# Patient Record
Sex: Female | Born: 1960 | Race: White | Hispanic: No | Marital: Married | Smoking: Never smoker
Health system: Southern US, Community
[De-identification: ages and names within clinical notes are randomized; demographics above are authoritative.]

## PROBLEM LIST (undated history)

## (undated) DIAGNOSIS — E039 Hypothyroidism, unspecified: Secondary | ICD-10-CM

## (undated) DIAGNOSIS — I10 Essential (primary) hypertension: Secondary | ICD-10-CM

## (undated) DIAGNOSIS — M1990 Unspecified osteoarthritis, unspecified site: Secondary | ICD-10-CM

## (undated) DIAGNOSIS — G4730 Sleep apnea, unspecified: Secondary | ICD-10-CM

## (undated) DIAGNOSIS — K219 Gastro-esophageal reflux disease without esophagitis: Secondary | ICD-10-CM

---

## 1999-12-03 HISTORY — PX: KNEE ARTHROSCOPY WITH ANTERIOR CRUCIATE LIGAMENT (ACL) REPAIR: SHX5644

## 2003-12-03 HISTORY — PX: ABDOMINAL HYSTERECTOMY: SHX81

## 2008-12-02 HISTORY — PX: JOINT REPLACEMENT: SHX530

## 2009-10-23 ENCOUNTER — Inpatient Hospital Stay (HOSPITAL_COMMUNITY): Admission: RE | Admit: 2009-10-23 | Discharge: 2009-10-26 | Payer: Self-pay | Admitting: Orthopedic Surgery

## 2011-03-06 LAB — CBC
HCT: 29.2 % — ABNORMAL LOW (ref 36.0–46.0)
HCT: 30.6 % — ABNORMAL LOW (ref 36.0–46.0)
HCT: 32.4 % — ABNORMAL LOW (ref 36.0–46.0)
Hemoglobin: 10.5 g/dL — ABNORMAL LOW (ref 12.0–15.0)
Hemoglobin: 11 g/dL — ABNORMAL LOW (ref 12.0–15.0)
Hemoglobin: 13.2 g/dL (ref 12.0–15.0)
MCHC: 34.4 g/dL (ref 30.0–36.0)
MCV: 84.5 fL (ref 78.0–100.0)
MCV: 85.1 fL (ref 78.0–100.0)
Platelets: 165 10*3/uL (ref 150–400)
RBC: 3.62 MIL/uL — ABNORMAL LOW (ref 3.87–5.11)
RDW: 15.3 % (ref 11.5–15.5)
WBC: 5.5 10*3/uL (ref 4.0–10.5)
WBC: 8.1 10*3/uL (ref 4.0–10.5)
WBC: 9.4 10*3/uL (ref 4.0–10.5)

## 2011-03-06 LAB — BASIC METABOLIC PANEL
BUN: 8 mg/dL (ref 6–23)
CO2: 26 mEq/L (ref 19–32)
Chloride: 100 mEq/L (ref 96–112)
Chloride: 101 mEq/L (ref 96–112)
GFR calc Af Amer: >60 mL/min (ref >60)
GFR calc non Af Amer: >60 mL/min (ref >60)
Potassium: 3.6 mEq/L (ref 3.5–5.1)
Potassium: 3.8 mEq/L (ref 3.5–5.1)
Potassium: 4 mEq/L (ref 3.5–5.1)
Sodium: 132 mEq/L — ABNORMAL LOW (ref 135–145)
Sodium: 134 mEq/L — ABNORMAL LOW (ref 135–145)

## 2011-03-06 LAB — URINALYSIS, ROUTINE W REFLEX MICROSCOPIC
Glucose, UA: NEGATIVE mg/dL
Hgb urine dipstick: NEGATIVE
Protein, ur: NEGATIVE mg/dL
Specific Gravity, Urine: 1.014 (ref 1.005–1.030)
pH: 7 (ref 5.0–8.0)

## 2011-03-06 LAB — COMPREHENSIVE METABOLIC PANEL
ALT: 24 U/L (ref 0–35)
Albumin: 4.1 g/dL (ref 3.5–5.2)
Alkaline Phosphatase: 68 U/L (ref 39–117)
Chloride: 108 mEq/L (ref 96–112)
Glucose, Bld: 97 mg/dL (ref 70–99)
Potassium: 4.1 mEq/L (ref 3.5–5.1)
Sodium: 141 mEq/L (ref 135–145)
Total Bilirubin: 0.5 mg/dL (ref 0.3–1.2)
Total Protein: 7.5 g/dL (ref 6.0–8.3)

## 2011-03-06 LAB — PROTIME-INR
INR: 1.6 — ABNORMAL HIGH (ref 0.00–1.49)
Prothrombin Time: 13.2 seconds (ref 11.6–15.2)

## 2011-03-06 LAB — TYPE AND SCREEN: Antibody Screen: NEGATIVE

## 2011-03-06 LAB — ABO/RH: ABO/RH(D): O NEG

## 2013-12-31 ENCOUNTER — Ambulatory Visit: Payer: Self-pay | Admitting: Specialist

## 2013-12-31 DIAGNOSIS — I10 Essential (primary) hypertension: Secondary | ICD-10-CM

## 2013-12-31 LAB — COMPREHENSIVE METABOLIC PANEL
ALK PHOS: 78 U/L
AST: 22 U/L (ref 15–37)
Albumin: 4 g/dL (ref 3.4–5.0)
Anion Gap: 4 — ABNORMAL LOW (ref 7–16)
BUN: 23 mg/dL — ABNORMAL HIGH (ref 7–18)
Bilirubin,Total: 0.5 mg/dL (ref 0.2–1.0)
CHLORIDE: 107 mmol/L (ref 98–107)
CO2: 26 mmol/L (ref 21–32)
Calcium, Total: 9.4 mg/dL (ref 8.5–10.1)
Creatinine: 0.84 mg/dL (ref 0.60–1.30)
EGFR (Non-African Amer.): >60
Glucose: 94 mg/dL (ref 65–99)
Osmolality: 277 (ref 275–301)
POTASSIUM: 4.1 mmol/L (ref 3.5–5.1)
SGPT (ALT): 23 U/L (ref 12–78)
Sodium: 137 mmol/L (ref 136–145)
TOTAL PROTEIN: 7.4 g/dL (ref 6.4–8.2)

## 2013-12-31 LAB — CBC WITH DIFFERENTIAL/PLATELET
BASOS ABS: 0 10*3/uL (ref 0.0–0.1)
Basophil %: 0.6 %
Eosinophil #: 0.1 10*3/uL (ref 0.0–0.7)
Eosinophil %: 1.4 %
HCT: 40.4 % (ref 35.0–47.0)
HGB: 13.7 g/dL (ref 12.0–16.0)
Lymphocyte #: 1.3 10*3/uL (ref 1.0–3.6)
Lymphocyte %: 17.6 %
MCH: 28.9 pg (ref 26.0–34.0)
MCHC: 34.1 g/dL (ref 32.0–36.0)
MCV: 85 fL (ref 80–100)
MONO ABS: 0.5 x10 3/mm (ref 0.2–0.9)
Monocyte %: 7.3 %
NEUTROS ABS: 5.3 10*3/uL (ref 1.4–6.5)
Neutrophil %: 73.1 %
PLATELETS: 235 10*3/uL (ref 150–440)
RBC: 4.76 10*6/uL (ref 3.80–5.20)
RDW: 14.2 % (ref 11.5–14.5)
WBC: 7.3 10*3/uL (ref 3.6–11.0)

## 2013-12-31 LAB — PROTIME-INR
INR: 1
Prothrombin Time: 13.3 secs (ref 11.5–14.7)

## 2013-12-31 LAB — PHOSPHORUS: Phosphorus: 2.5 mg/dL (ref 2.5–4.9)

## 2013-12-31 LAB — APTT: ACTIVATED PTT: 29.7 s (ref 23.6–35.9)

## 2013-12-31 LAB — BILIRUBIN, DIRECT: Bilirubin, Direct: 0.1 mg/dL (ref 0.00–0.20)

## 2013-12-31 LAB — IRON AND TIBC
IRON SATURATION: 25 %
IRON: 87 ug/dL (ref 50–170)
Iron Bind.Cap.(Total): 353 ug/dL (ref 250–450)
Unbound Iron-Bind.Cap.: 266 ug/dL

## 2013-12-31 LAB — FERRITIN: Ferritin (ARMC): 28 ng/mL (ref 8–388)

## 2013-12-31 LAB — AMYLASE: AMYLASE: 48 U/L (ref 25–115)

## 2013-12-31 LAB — HEMOGLOBIN A1C: Hemoglobin A1C: 5.8 % (ref 4.2–6.3)

## 2013-12-31 LAB — MAGNESIUM: Magnesium: 1.9 mg/dL

## 2013-12-31 LAB — LIPASE, BLOOD: LIPASE: 121 U/L (ref 73–393)

## 2013-12-31 LAB — FOLATE: FOLIC ACID: 13.4 ng/mL (ref 3.1–100.0)

## 2013-12-31 LAB — TSH: THYROID STIMULATING HORM: 3.01 u[IU]/mL

## 2014-01-12 ENCOUNTER — Ambulatory Visit: Payer: Self-pay | Admitting: Specialist

## 2014-01-30 ENCOUNTER — Ambulatory Visit: Payer: Self-pay | Admitting: Specialist

## 2014-04-22 ENCOUNTER — Ambulatory Visit: Payer: Self-pay | Admitting: Gastroenterology

## 2014-04-30 LAB — PATHOLOGY REPORT

## 2014-05-31 ENCOUNTER — Other Ambulatory Visit: Payer: Self-pay | Admitting: Orthopaedic Surgery

## 2014-05-31 DIAGNOSIS — M25551 Pain in right hip: Secondary | ICD-10-CM

## 2014-06-02 ENCOUNTER — Ambulatory Visit
Admission: RE | Admit: 2014-06-02 | Discharge: 2014-06-02 | Disposition: A | Discharge status: Home / Self Care | Payer: BC Managed Care – PPO | Source: Ambulatory Visit | Attending: Orthopaedic Surgery | Admitting: Orthopaedic Surgery

## 2014-06-02 DIAGNOSIS — M25551 Pain in right hip: Secondary | ICD-10-CM

## 2014-07-02 HISTORY — PX: LAPAROSCOPIC GASTRIC SLEEVE RESECTION: SHX5895

## 2014-09-20 ENCOUNTER — Other Ambulatory Visit (HOSPITAL_COMMUNITY): Payer: Self-pay | Admitting: Orthopaedic Surgery

## 2014-10-18 ENCOUNTER — Encounter (HOSPITAL_COMMUNITY): Payer: Self-pay

## 2014-10-18 ENCOUNTER — Encounter (HOSPITAL_COMMUNITY)
Admission: RE | Admit: 2014-10-18 | Discharge: 2014-10-18 | Disposition: A | Discharge status: Home / Self Care | Payer: BC Managed Care – PPO | Source: Ambulatory Visit | Attending: Orthopaedic Surgery | Admitting: Orthopaedic Surgery

## 2014-10-18 ENCOUNTER — Ambulatory Visit (HOSPITAL_COMMUNITY)
Admission: RE | Admit: 2014-10-18 | Discharge: 2014-10-18 | Disposition: A | Discharge status: Home / Self Care | Payer: BC Managed Care – PPO | Source: Ambulatory Visit | Attending: Anesthesiology | Admitting: Anesthesiology

## 2014-10-18 DIAGNOSIS — Z01811 Encounter for preprocedural respiratory examination: Secondary | ICD-10-CM | POA: Insufficient documentation

## 2014-10-18 DIAGNOSIS — I10 Essential (primary) hypertension: Secondary | ICD-10-CM | POA: Insufficient documentation

## 2014-10-18 DIAGNOSIS — Z01812 Encounter for preprocedural laboratory examination: Secondary | ICD-10-CM | POA: Insufficient documentation

## 2014-10-18 HISTORY — DX: Sleep apnea, unspecified: G47.30

## 2014-10-18 HISTORY — DX: Gastro-esophageal reflux disease without esophagitis: K21.9

## 2014-10-18 HISTORY — DX: Unspecified osteoarthritis, unspecified site: M19.90

## 2014-10-18 HISTORY — DX: Essential (primary) hypertension: I10

## 2014-10-18 HISTORY — DX: Hypothyroidism, unspecified: E03.9

## 2014-10-18 LAB — CBC
HCT: 39.5 % (ref 36.0–46.0)
Hemoglobin: 12.9 g/dL (ref 12.0–15.0)
MCH: 29.3 pg (ref 26.0–34.0)
MCHC: 32.7 g/dL (ref 30.0–36.0)
MCV: 89.6 fL (ref 78.0–100.0)
Platelets: 229 10*3/uL (ref 150–400)
RBC: 4.41 MIL/uL (ref 3.87–5.11)
RDW: 14 % (ref 11.5–15.5)
WBC: 7.6 10*3/uL (ref 4.0–10.5)

## 2014-10-18 LAB — URINALYSIS, ROUTINE W REFLEX MICROSCOPIC
Bilirubin Urine: NEGATIVE
Glucose, UA: NEGATIVE mg/dL
Hgb urine dipstick: NEGATIVE
Ketones, ur: NEGATIVE mg/dL
Leukocytes, UA: NEGATIVE
Nitrite: NEGATIVE
Protein, ur: NEGATIVE mg/dL
Specific Gravity, Urine: 1.009 (ref 1.005–1.030)
Urobilinogen, UA: 0.2 mg/dL (ref 0.0–1.0)
pH: 6 (ref 5.0–8.0)

## 2014-10-18 LAB — BASIC METABOLIC PANEL
Anion gap: 13 (ref 5–15)
BUN: 19 mg/dL (ref 6–23)
CALCIUM: 10.8 mg/dL — AB (ref 8.4–10.5)
CHLORIDE: 102 meq/L (ref 96–112)
CO2: 23 meq/L (ref 19–32)
CREATININE: 0.67 mg/dL (ref 0.50–1.10)
GFR calc Af Amer: >90 mL/min (ref >90)
GFR calc non Af Amer: >90 mL/min (ref >90)
Glucose, Bld: 93 mg/dL (ref 70–99)
Potassium: 4.1 mEq/L (ref 3.7–5.3)
Sodium: 138 mEq/L (ref 137–147)

## 2014-10-18 LAB — PROTIME-INR
INR: 1.06 (ref 0.00–1.49)
Prothrombin Time: 13.9 seconds (ref 11.6–15.2)

## 2014-10-18 LAB — SURGICAL PCR SCREEN
MRSA, PCR: NEGATIVE
Staphylococcus aureus: POSITIVE — AB

## 2014-10-18 LAB — APTT: APTT: 31 s (ref 24–37)

## 2014-10-18 NOTE — Patient Instructions (Addendum)
20 Lisa DuosSybil G [name]  10/18/2014   Your procedure is scheduled on: Friday 10/28/14  [facility] at 05:30 AM.  Call this number if you have problems the morning of surgery [phone]   Remember: Please bring CPAP mask and tubing on day of surgery.    Do not eat food or drink liquids After Midnight.     Take these medicines the morning of surgery with A SIP OF WATER: synthroid   Do not wear jewelry, make-up or nail polish.  Do not wear lotions, powders, or perfumes.   Do not shave 48 hours prior to surgery. Men may shave face and neck.  Do not bring valuables to the hospital.  Contacts, dentures or bridgework may not be worn into surgery.  Leave suitcase in the car. After surgery it may be brought to your room.  For patients admitted to the hospital, checkout time is 11:00 AM the day of discharge.   Please read over the following fact sheets that you were given: MRSA Information   [place] - Preparing for Surgery Before surgery, you can play an important role.  Because skin is not sterile, your skin needs to be as free of germs as possible.  You can reduce the number of germs on your skin by washing with CHG (chlorahexidine gluconate) soap before surgery.  CHG is an antiseptic cleaner which kills germs and bonds with the skin to continue killing germs even after washing. Please DO NOT use if you have an allergy to CHG or antibacterial soaps.  If your skin becomes reddened/irritated stop using the CHG and inform your nurse when you arrive at Short Stay. Do not shave (including legs and underarms) for at least 48 hours prior to the first CHG shower.  You may shave your face/neck. Please follow these instructions carefully:  1.  Shower with CHG Soap the night before surgery and the  morning of Surgery.  2.  If you choose to wash your hair, wash your hair first as usual with your  normal  shampoo.  3.  After you shampoo, rinse your hair and body thoroughly to  remove the  shampoo.                            4.  Use CHG as you would any other liquid soap.  You can apply chg directly  to the skin and wash                       Gently with a scrungie or clean washcloth.  5.  Apply the CHG Soap to your body ONLY FROM THE NECK DOWN.   Do not use on face/ open                           Wound or open sores. Avoid contact with eyes, ears mouth and genitals (private parts).                       Wash face,  Genitals (private parts) with your normal soap.             6.  Wash thoroughly, paying special attention to the area where your surgery  will be performed.  7.  Thoroughly rinse your body with warm water from the neck down.  8.  DO NOT shower/wash with your  normal soap after using and rinsing off  the CHG Soap.                9.  Pat yourself dry with a clean towel.            10.  Wear clean pajamas.            11.  Place clean sheets on your bed the night of your first shower and do not  sleep with pets. Day of Surgery : Do not apply any lotions/deodorants the morning of surgery.  Please wear clean clothes to the hospital/surgery center.  FAILURE TO FOLLOW THESE INSTRUCTIONS MAY RESULT IN THE CANCELLATION OF YOUR SURGERY PATIENT SIGNATURE_________________________________  NURSE SIGNATURE__________________________________  ________________________________________________________________________  WHAT IS A BLOOD TRANSFUSION? Blood Transfusion Information  A transfusion is the replacement of blood or some of its parts. Blood is made up of multiple cells which provide different functions.  Red blood cells carry oxygen and are used for blood loss replacement.  Rix blood cells fight against infection.  Platelets control bleeding.  Plasma helps clot blood.  Other blood products are available for specialized needs, such as hemophilia or other clotting disorders. BEFORE THE TRANSFUSION  Who gives blood for transfusions?   Healthy volunteers who  are fully evaluated to make sure their blood is safe. This is blood bank blood. Transfusion therapy is the safest it has ever been in the practice of medicine. Before blood is taken from a donor, a complete history is taken to make sure that person has no history of diseases nor engages in risky social behavior (examples are intravenous drug use or sexual activity with multiple partners). The donor's travel history is screened to minimize risk of transmitting infections, such as malaria. The donated blood is tested for signs of infectious diseases, such as HIV and hepatitis. The blood is then tested to be sure it is compatible with you in order to minimize the chance of a transfusion reaction. If you or a relative donates blood, this is often done in anticipation of surgery and is not appropriate for emergency situations. It takes many days to process the donated blood. RISKS AND COMPLICATIONS Although transfusion therapy is very safe and saves many lives, the main dangers of transfusion include:  1. Getting an infectious disease. 2. Developing a transfusion reaction. This is an allergic reaction to something in the blood you were given. Every precaution is taken to prevent this. The decision to have a blood transfusion has been considered carefully by your caregiver before blood is given. Blood is not given unless the benefits outweigh the risks. AFTER THE TRANSFUSION  Right after receiving a blood transfusion, you will usually feel much better and more energetic. This is especially true if your red blood cells have gotten low (anemic). The transfusion raises the level of the red blood cells which carry oxygen, and this usually causes an energy increase.  The nurse administering the transfusion will monitor you carefully for complications. HOME CARE INSTRUCTIONS  No special instructions are needed after a transfusion. You may find your energy is better. Speak with your caregiver about any limitations  on activity for underlying diseases you may have. SEEK MEDICAL CARE IF:   Your condition is not improving after your transfusion.  You develop redness or irritation at the intravenous (IV) site. SEEK IMMEDIATE MEDICAL CARE IF:  Any of the following symptoms occur over the next 12 hours:  Shaking chills.  You have a temperature by mouth  above 102 F (38.9 C), not controlled by medicine.  Chest, back, or muscle pain.  People around you feel you are not acting correctly or are confused.  Shortness of breath or difficulty breathing.  Dizziness and fainting.  You get a rash or develop hives.  You have a decrease in urine output.  Your urine turns a dark color or changes to pink, red, or brown. Any of the following symptoms occur over the next 10 days:  You have a temperature by mouth above 102 F (38.9 C), not controlled by medicine.  Shortness of breath.  Weakness after normal activity.  The [place]ord part of the eye turns yellow (jaundice).  You have a decrease in the amount of urine or are urinating less often.  Your urine turns a dark color or changes to pink, red, or brown. Document Released: 11/15/2000 Document Revised: 02/10/2012 Document Reviewed: 07/04/2008 ExitCare Patient Information 2014 [place], [place].  _______________________________________________________________________  Incentive Spirometer  An incentive spirometer is a tool that can help keep your lungs clear and active. This tool measures how well you are filling your lungs with each breath. Taking long deep breaths may help reverse or decrease the chance of developing breathing (pulmonary) problems (especially infection) following:  A long period of time when you are unable to move or be active. BEFORE THE PROCEDURE   If the spirometer includes an indicator to show your best effort, your nurse or respiratory therapist will set it to a desired goal.  If possible, sit up straight or lean slightly  forward. Try not to slouch.  Hold the incentive spirometer in an upright position. INSTRUCTIONS FOR USE  3. Sit on the edge of your bed if possible, or sit up as far as you can in bed or on a chair. 4. Hold the incentive spirometer in an upright position. 5. Breathe out normally. 6. Place the mouthpiece in your mouth and seal your lips tightly around it. 7. Breathe in slowly and as deeply as possible, raising the piston or the ball toward the top of the column. 8. Hold your breath for 3-5 seconds or for as long as possible. Allow the piston or ball to fall to the bottom of the column. 9. Remove the mouthpiece from your mouth and breathe out normally. 10. Rest for a few seconds and repeat Steps 1 through 7 at least 10 times every 1-2 hours when you are awake. Take your time and take a few normal breaths between deep breaths. 11. The spirometer may include an indicator to show your best effort. Use the indicator as a goal to work toward during each repetition. 12. After each set of 10 deep breaths, practice coughing to be sure your lungs are clear. If you have an incision (the cut made at the time of surgery), support your incision when coughing by placing a pillow or rolled up towels firmly against it. Once you are able to get out of bed, walk around indoors and cough well. You may stop using the incentive spirometer when instructed by your caregiver.  RISKS AND COMPLICATIONS  Take your time so you do not get dizzy or light-headed.  If you are in pain, you may need to take or ask for pain medication before doing incentive spirometry. It is harder to take a deep breath if you are having pain. AFTER USE  Rest and breathe slowly and easily.  It can be helpful to keep track of a log of your progress. Your caregiver can provide  you with a simple table to help with this. If you are using the spirometer at home, follow these instructions: SEEK MEDICAL CARE IF:   You are having difficultly using  the spirometer.  You have trouble using the spirometer as often as instructed.  Your pain medication is not giving enough relief while using the spirometer.  You develop fever of 100.5 F (38.1 C) or higher. SEEK IMMEDIATE MEDICAL CARE IF:   You cough up bloody sputum that had not been present before.  You develop fever of 102 F (38.9 C) or greater.  You develop worsening pain at or near the incision site. MAKE SURE YOU:   Understand these instructions.  Will watch your condition.  Will get help right away if you are not doing well or get worse. Document Released: 03/31/2007 Document Revised: 02/10/2012 Document Reviewed: 06/01/2007 [facility] Patient Information 2014 [place], [place].   ________________________________________________________________________

## 2014-10-18 NOTE — Progress Notes (Signed)
EKG 12/31/13 on chart, sleep study report 03/15/14 [facility] on chart

## 2014-10-28 ENCOUNTER — Inpatient Hospital Stay (HOSPITAL_COMMUNITY): Payer: BC Managed Care – PPO | Admitting: Certified Registered"

## 2014-10-28 ENCOUNTER — Inpatient Hospital Stay (HOSPITAL_COMMUNITY): Payer: BC Managed Care – PPO

## 2014-10-28 ENCOUNTER — Encounter (HOSPITAL_COMMUNITY): Payer: Self-pay | Admitting: *Deleted

## 2014-10-28 ENCOUNTER — Encounter (HOSPITAL_COMMUNITY)
Admission: RE | Disposition: A | Discharge status: Home / Self Care | Payer: Self-pay | Source: Ambulatory Visit | Attending: Orthopaedic Surgery

## 2014-10-28 ENCOUNTER — Inpatient Hospital Stay (HOSPITAL_COMMUNITY)
Admission: RE | Admit: 2014-10-28 | Discharge: 2014-10-29 | DRG: 470 | Disposition: A | Discharge status: Home / Self Care | Payer: BC Managed Care – PPO | Source: Ambulatory Visit | Attending: Orthopaedic Surgery | Admitting: Orthopaedic Surgery

## 2014-10-28 DIAGNOSIS — Z9884 Bariatric surgery status: Secondary | ICD-10-CM

## 2014-10-28 DIAGNOSIS — K219 Gastro-esophageal reflux disease without esophagitis: Secondary | ICD-10-CM | POA: Diagnosis present

## 2014-10-28 DIAGNOSIS — Z90710 Acquired absence of both cervix and uterus: Secondary | ICD-10-CM

## 2014-10-28 DIAGNOSIS — M1611 Unilateral primary osteoarthritis, right hip: Secondary | ICD-10-CM | POA: Diagnosis present

## 2014-10-28 DIAGNOSIS — D62 Acute posthemorrhagic anemia: Secondary | ICD-10-CM | POA: Diagnosis not present

## 2014-10-28 DIAGNOSIS — I959 Hypotension, unspecified: Secondary | ICD-10-CM | POA: Diagnosis present

## 2014-10-28 DIAGNOSIS — I10 Essential (primary) hypertension: Secondary | ICD-10-CM | POA: Diagnosis present

## 2014-10-28 DIAGNOSIS — E039 Hypothyroidism, unspecified: Secondary | ICD-10-CM | POA: Diagnosis present

## 2014-10-28 DIAGNOSIS — G4730 Sleep apnea, unspecified: Secondary | ICD-10-CM | POA: Diagnosis present

## 2014-10-28 DIAGNOSIS — Z419 Encounter for procedure for purposes other than remedying health state, unspecified: Secondary | ICD-10-CM

## 2014-10-28 DIAGNOSIS — Z96641 Presence of right artificial hip joint: Secondary | ICD-10-CM

## 2014-10-28 HISTORY — PX: TOTAL HIP ARTHROPLASTY: SHX124

## 2014-10-28 LAB — TYPE AND SCREEN
ABO/RH(D): O NEG
Antibody Screen: NEGATIVE

## 2014-10-28 SURGERY — ARTHROPLASTY, HIP, TOTAL, ANTERIOR APPROACH
Anesthesia: Spinal | Site: Hip | Laterality: Right

## 2014-10-28 MED ORDER — LACTATED RINGERS IV SOLN
INTRAVENOUS | Status: DC | PRN
  Administered 2014-10-28 (×2): via INTRAVENOUS

## 2014-10-28 MED ORDER — CEFAZOLIN SODIUM-DEXTROSE 2-3 GM-% IV SOLR
INTRAVENOUS | Status: AC
  Filled 2014-10-28: qty 50

## 2014-10-28 MED ORDER — KETOROLAC TROMETHAMINE 30 MG/ML IJ SOLN: 15.0000 mg | Freq: Once | INTRAMUSCULAR | Status: DC | PRN

## 2014-10-28 MED ORDER — PHENYLEPHRINE 40 MCG/ML (10ML) SYRINGE FOR IV PUSH (FOR BLOOD PRESSURE SUPPORT)
PREFILLED_SYRINGE | INTRAVENOUS | Status: AC
  Filled 2014-10-28: qty 10

## 2014-10-28 MED ORDER — LEVOTHYROXINE SODIUM 88 MCG PO TABS
88.0000 ug | ORAL_TABLET | Freq: Every day | ORAL | Status: DC
  Administered 2014-10-29: 88 ug via ORAL
  Filled 2014-10-28 (×2): qty 1

## 2014-10-28 MED ORDER — SODIUM CHLORIDE 0.9 % IV SOLN
INTRAVENOUS | Status: DC
  Administered 2014-10-28: 10:00:00 via INTRAVENOUS

## 2014-10-28 MED ORDER — DOCUSATE SODIUM 100 MG PO CAPS
100.0000 mg | ORAL_CAPSULE | Freq: Two times a day (BID) | ORAL | Status: DC
  Administered 2014-10-28 – 2014-10-29 (×2): 100 mg via ORAL

## 2014-10-28 MED ORDER — CEFAZOLIN SODIUM 1-5 GM-% IV SOLN
1.0000 g | Freq: Four times a day (QID) | INTRAVENOUS | Status: AC
  Administered 2014-10-28 (×2): 1 g via INTRAVENOUS
  Filled 2014-10-28 (×2): qty 50

## 2014-10-28 MED ORDER — PROPOFOL 10 MG/ML IV BOLUS
INTRAVENOUS | Status: AC
  Filled 2014-10-28: qty 20

## 2014-10-28 MED ORDER — PHENYLEPHRINE HCL 10 MG/ML IJ SOLN
INTRAMUSCULAR | Status: AC
  Filled 2014-10-28: qty 1

## 2014-10-28 MED ORDER — 0.9 % SODIUM CHLORIDE (POUR BTL) OPTIME
TOPICAL | Status: DC | PRN
  Administered 2014-10-28: 1000 mL

## 2014-10-28 MED ORDER — ACETAMINOPHEN 650 MG RE SUPP: 650.0000 mg | Freq: Four times a day (QID) | RECTAL | Status: DC | PRN

## 2014-10-28 MED ORDER — PROPOFOL 10 MG/ML IV BOLUS
INTRAVENOUS | Status: DC | PRN
  Administered 2014-10-28: 20 mg via INTRAVENOUS

## 2014-10-28 MED ORDER — METOCLOPRAMIDE HCL 10 MG PO TABS: 5.0000 mg | ORAL_TABLET | Freq: Three times a day (TID) | ORAL | Status: DC | PRN

## 2014-10-28 MED ORDER — MIDAZOLAM HCL 2 MG/2ML IJ SOLN
0.5000 mg | Freq: Once | INTRAMUSCULAR | Status: DC | PRN
Start: 2014-10-28 — End: 2014-10-28

## 2014-10-28 MED ORDER — MEPERIDINE HCL 50 MG/ML IJ SOLN: 6.2500 mg | INTRAMUSCULAR | Status: DC | PRN

## 2014-10-28 MED ORDER — PROPOFOL INFUSION 10 MG/ML OPTIME
INTRAVENOUS | Status: DC | PRN
  Administered 2014-10-28: 75 ug/kg/min via INTRAVENOUS

## 2014-10-28 MED ORDER — METHOCARBAMOL 500 MG PO TABS: 500.0000 mg | ORAL_TABLET | Freq: Four times a day (QID) | ORAL | Status: DC | PRN

## 2014-10-28 MED ORDER — DEXAMETHASONE SODIUM PHOSPHATE 10 MG/ML IJ SOLN
INTRAMUSCULAR | Status: AC
  Filled 2014-10-28: qty 1

## 2014-10-28 MED ORDER — PROMETHAZINE HCL 25 MG/ML IJ SOLN: 6.2500 mg | INTRAMUSCULAR | Status: DC | PRN

## 2014-10-28 MED ORDER — ASPIRIN EC 325 MG PO TBEC
325.0000 mg | DELAYED_RELEASE_TABLET | Freq: Two times a day (BID) | ORAL | Status: DC
  Administered 2014-10-28 – 2014-10-29 (×2): 325 mg via ORAL
  Filled 2014-10-28 (×4): qty 1

## 2014-10-28 MED ORDER — POLYETHYLENE GLYCOL 3350 17 G PO PACK
17.0000 g | PACK | Freq: Every day | ORAL | Status: DC | PRN
Start: 2014-10-28 — End: 2014-10-29

## 2014-10-28 MED ORDER — ONDANSETRON HCL 4 MG PO TABS: 4.0000 mg | ORAL_TABLET | Freq: Four times a day (QID) | ORAL | Status: DC | PRN

## 2014-10-28 MED ORDER — FERROUS SULFATE 325 (65 FE) MG PO TABS
325.0000 mg | ORAL_TABLET | Freq: Three times a day (TID) | ORAL | Status: DC
  Administered 2014-10-29: 325 mg via ORAL
  Filled 2014-10-28 (×6): qty 1

## 2014-10-28 MED ORDER — ONDANSETRON HCL 4 MG/2ML IJ SOLN: 4.0000 mg | Freq: Four times a day (QID) | INTRAMUSCULAR | Status: DC | PRN

## 2014-10-28 MED ORDER — ALUM & MAG HYDROXIDE-SIMETH 200-200-20 MG/5ML PO SUSP: 30.0000 mL | ORAL | Status: DC | PRN

## 2014-10-28 MED ORDER — SODIUM CHLORIDE 0.9 % IR SOLN
Status: DC | PRN
  Administered 2014-10-28: 1000 mL

## 2014-10-28 MED ORDER — METOCLOPRAMIDE HCL 5 MG/ML IJ SOLN
5.0000 mg | Freq: Three times a day (TID) | INTRAMUSCULAR | Status: DC | PRN
Start: 2014-10-28 — End: 2014-10-29

## 2014-10-28 MED ORDER — MIDAZOLAM HCL 2 MG/2ML IJ SOLN
INTRAMUSCULAR | Status: AC
  Filled 2014-10-28: qty 2

## 2014-10-28 MED ORDER — DIPHENHYDRAMINE HCL 12.5 MG/5ML PO ELIX: 12.5000 mg | ORAL_SOLUTION | ORAL | Status: DC | PRN

## 2014-10-28 MED ORDER — FENTANYL CITRATE 0.05 MG/ML IJ SOLN
INTRAMUSCULAR | Status: DC | PRN
  Administered 2014-10-28 (×2): 50 ug via INTRAVENOUS

## 2014-10-28 MED ORDER — DEXAMETHASONE SODIUM PHOSPHATE 10 MG/ML IJ SOLN
INTRAMUSCULAR | Status: DC | PRN
  Administered 2014-10-28: 10 mg via INTRAVENOUS

## 2014-10-28 MED ORDER — PANTOPRAZOLE SODIUM 40 MG PO TBEC
80.0000 mg | DELAYED_RELEASE_TABLET | Freq: Every day | ORAL | Status: DC
  Administered 2014-10-28 – 2014-10-29 (×2): 80 mg via ORAL
  Filled 2014-10-28 (×2): qty 2

## 2014-10-28 MED ORDER — PHENOL 1.4 % MT LIQD: 1.0000 | OROMUCOSAL | Status: DC | PRN

## 2014-10-28 MED ORDER — EPHEDRINE SULFATE 50 MG/ML IJ SOLN
INTRAMUSCULAR | Status: AC
  Filled 2014-10-28: qty 1

## 2014-10-28 MED ORDER — HYDROMORPHONE HCL 1 MG/ML IJ SOLN: 1.0000 mg | INTRAMUSCULAR | Status: DC | PRN

## 2014-10-28 MED ORDER — TRANEXAMIC ACID 100 MG/ML IV SOLN
1000.0000 mg | INTRAVENOUS | Status: AC
  Administered 2014-10-28: 1000 mg via INTRAVENOUS
  Filled 2014-10-28: qty 10

## 2014-10-28 MED ORDER — ONDANSETRON HCL 4 MG/2ML IJ SOLN
INTRAMUSCULAR | Status: AC
  Filled 2014-10-28: qty 2

## 2014-10-28 MED ORDER — ZOLPIDEM TARTRATE 5 MG PO TABS: 5.0000 mg | ORAL_TABLET | Freq: Every evening | ORAL | Status: DC | PRN

## 2014-10-28 MED ORDER — ACETAMINOPHEN 325 MG PO TABS: 650.0000 mg | ORAL_TABLET | Freq: Four times a day (QID) | ORAL | Status: DC | PRN

## 2014-10-28 MED ORDER — BUPIVACAINE HCL (PF) 0.5 % IJ SOLN
INTRAMUSCULAR | Status: DC | PRN
  Administered 2014-10-28: 3 mL

## 2014-10-28 MED ORDER — ONDANSETRON HCL 4 MG/2ML IJ SOLN
INTRAMUSCULAR | Status: DC | PRN
  Administered 2014-10-28: 4 mg via INTRAVENOUS

## 2014-10-28 MED ORDER — OXYCODONE HCL 5 MG PO TABS
5.0000 mg | ORAL_TABLET | ORAL | Status: DC | PRN
  Administered 2014-10-28: 5 mg via ORAL
  Filled 2014-10-28: qty 1

## 2014-10-28 MED ORDER — FENTANYL CITRATE 0.05 MG/ML IJ SOLN
INTRAMUSCULAR | Status: AC
  Filled 2014-10-28: qty 2

## 2014-10-28 MED ORDER — SODIUM CHLORIDE 0.9 % IJ SOLN
INTRAMUSCULAR | Status: AC
  Filled 2014-10-28: qty 10

## 2014-10-28 MED ORDER — EPHEDRINE SULFATE 50 MG/ML IJ SOLN
INTRAMUSCULAR | Status: DC | PRN
  Administered 2014-10-28 (×2): 10 mg via INTRAVENOUS

## 2014-10-28 MED ORDER — MIDAZOLAM HCL 5 MG/5ML IJ SOLN
INTRAMUSCULAR | Status: DC | PRN
  Administered 2014-10-28: 2 mg via INTRAVENOUS

## 2014-10-28 MED ORDER — KETOROLAC TROMETHAMINE 15 MG/ML IJ SOLN
7.5000 mg | Freq: Four times a day (QID) | INTRAMUSCULAR | Status: AC
  Administered 2014-10-28 – 2014-10-29 (×4): 7.5 mg via INTRAVENOUS
  Filled 2014-10-28 (×3): qty 1

## 2014-10-28 MED ORDER — CEFAZOLIN SODIUM-DEXTROSE 2-3 GM-% IV SOLR
2.0000 g | INTRAVENOUS | Status: AC
  Administered 2014-10-28: 2 g via INTRAVENOUS

## 2014-10-28 MED ORDER — LIDOCAINE HCL (CARDIAC) 20 MG/ML IV SOLN
INTRAVENOUS | Status: AC
  Filled 2014-10-28: qty 5

## 2014-10-28 MED ORDER — MENTHOL 3 MG MT LOZG: 1.0000 | LOZENGE | OROMUCOSAL | Status: DC | PRN

## 2014-10-28 MED ORDER — HYDROMORPHONE HCL 1 MG/ML IJ SOLN: 0.2500 mg | INTRAMUSCULAR | Status: DC | PRN

## 2014-10-28 MED ORDER — BUPIVACAINE HCL (PF) 0.5 % IJ SOLN
INTRAMUSCULAR | Status: AC
  Filled 2014-10-28: qty 30

## 2014-10-28 MED ORDER — DEXTROSE 5 % IV SOLN
500.0000 mg | Freq: Four times a day (QID) | INTRAVENOUS | Status: DC | PRN
  Administered 2014-10-28: 500 mg via INTRAVENOUS
  Filled 2014-10-28 (×2): qty 5

## 2014-10-28 MED ORDER — LIDOCAINE HCL (CARDIAC) 20 MG/ML IV SOLN
INTRAVENOUS | Status: DC | PRN
  Administered 2014-10-28: 20 mg via INTRAVENOUS

## 2014-10-28 SURGICAL SUPPLY — 38 items
BAG ZIPLOCK 12X15 (MISCELLANEOUS) IMPLANT
BENZOIN TINCTURE PRP APPL 2/3 (GAUZE/BANDAGES/DRESSINGS) IMPLANT
BLADE SAW SGTL 18X1.27X75 (BLADE) ×2 IMPLANT
CAPT HIP PF COP ×2 IMPLANT
CELLS DAT CNTRL 66122 CELL SVR (MISCELLANEOUS) ×1 IMPLANT
COVER PERINEAL POST (MISCELLANEOUS) ×2 IMPLANT
DRAPE C-ARM 42X120 X-RAY (DRAPES) ×2 IMPLANT
DRAPE STERI IOBAN 125X83 (DRAPES) ×2 IMPLANT
DRAPE U-SHAPE 47X51 STRL (DRAPES) ×6 IMPLANT
DRSG AQUACEL AG ADV 3.5X10 (GAUZE/BANDAGES/DRESSINGS) ×2 IMPLANT
DRSG XEROFORM 1X8 (GAUZE/BANDAGES/DRESSINGS) ×2 IMPLANT
DURAPREP 26ML APPLICATOR (WOUND CARE) ×2 IMPLANT
ELECT BLADE TIP CTD 4 INCH (ELECTRODE) ×2 IMPLANT
ELECT REM PT RETURN 9FT ADLT (ELECTROSURGICAL) ×2
ELECTRODE REM PT RTRN 9FT ADLT (ELECTROSURGICAL) ×1 IMPLANT
FACESHIELD WRAPAROUND (MASK) ×8 IMPLANT
GAUZE XEROFORM 1X8 LF (GAUZE/BANDAGES/DRESSINGS) IMPLANT
GLOVE BIO SURGEON STRL SZ7.5 (GLOVE) ×2 IMPLANT
GLOVE BIOGEL PI IND STRL 8 (GLOVE) ×2 IMPLANT
GLOVE BIOGEL PI INDICATOR 8 (GLOVE) ×2
GLOVE ECLIPSE 8.0 STRL XLNG CF (GLOVE) ×2 IMPLANT
GOWN STRL REUS W/TWL XL LVL3 (GOWN DISPOSABLE) ×4 IMPLANT
HANDPIECE INTERPULSE COAX TIP (DISPOSABLE) ×1
KIT BASIN OR (CUSTOM PROCEDURE TRAY) ×2 IMPLANT
PACK TOTAL JOINT (CUSTOM PROCEDURE TRAY) ×2 IMPLANT
RTRCTR WOUND ALEXIS 18CM MED (MISCELLANEOUS) ×2
SET HNDPC FAN SPRY TIP SCT (DISPOSABLE) ×1 IMPLANT
STAPLER VISISTAT 35W (STAPLE) ×2 IMPLANT
STRIP CLOSURE SKIN 1/2X4 (GAUZE/BANDAGES/DRESSINGS) IMPLANT
SUT ETHIBOND NAB CT1 #1 30IN (SUTURE) ×2 IMPLANT
SUT MNCRL AB 4-0 PS2 18 (SUTURE) IMPLANT
SUT VIC AB 0 CT1 36 (SUTURE) ×2 IMPLANT
SUT VIC AB 1 CT1 36 (SUTURE) ×2 IMPLANT
SUT VIC AB 2-0 CT1 27 (SUTURE) ×2
SUT VIC AB 2-0 CT1 TAPERPNT 27 (SUTURE) ×2 IMPLANT
TOWEL OR 17X26 10 PK STRL BLUE (TOWEL DISPOSABLE) ×2 IMPLANT
TOWEL OR NON WOVEN STRL DISP B (DISPOSABLE) ×2 IMPLANT
TRAY FOLEY CATH 14FRSI W/METER (CATHETERS) IMPLANT

## 2014-10-28 NOTE — Evaluation (Signed)
Physical Therapy Evaluation Patient Details Name: [name] MRN: [id number] DOB: 10/15/1961 Today's Date: 10/28/2014   History of Present Illness  R THR  Clinical Impression  Pt s/p R THR presents with decreased R LE strength/ROM and post op pain limiting functional mobility.  Pt should progress to d/c home with family assist and HHPT follow up.    Follow Up Recommendations Home health PT    Equipment Recommendations  None recommended by PT    Recommendations for Other Services OT consult     Precautions / Restrictions Precautions Precautions: Fall Restrictions Weight Bearing Restrictions: No Other Position/Activity Restrictions: WBAT      Mobility  Bed Mobility Overal bed mobility: Needs Assistance Bed Mobility: Supine to Sit     Supine to sit: Min assist     General bed mobility comments: cues for sequence and use of L LE to self assist  Transfers Overall transfer level: Needs assistance Equipment used: Rolling walker (2 wheeled) Transfers: Sit to/from Stand Sit to Stand: Min assist         General transfer comment: cues for LE management and use of UEs to self assist  Ambulation/Gait Ambulation/Gait assistance: Min assist Ambulation Distance (Feet): 123 Feet Assistive device: Rolling walker (2 wheeled) Gait Pattern/deviations: Step-to pattern;Step-through pattern;Decreased step length - right;Decreased step length - left;Shuffle;Trunk flexed     General Gait Details: cues for posture, position from RW and initial sequence  Stairs            Wheelchair Mobility    Modified Rankin (Stroke Patients Only)       Balance                                             Pertinent Vitals/Pain Pain Assessment: 0-10 Pain Score: 2  Pain Location: R hip and thigh Pain Descriptors / Indicators: Burning Pain Intervention(s): Limited activity within patient's tolerance;Monitored during session;Premedicated before session;Ice  applied    Home Living Family/patient expects to be discharged to:: Private residence Living Arrangements: Spouse/significant other Available Help at Discharge: Family Type of Home: House Home Access: Stairs to enter Entrance Stairs-Rails: None Entrance Stairs-Number of Steps: 2 Home Layout: One level Home Equipment: Environmental consultantWalker - 2 wheels;Bedside commode;Cane - single point      Prior Function Level of Independence: Independent               Hand Dominance   Dominant Hand: Right    Extremity/Trunk Assessment   Upper Extremity Assessment: Overall WFL for tasks assessed           Lower Extremity Assessment: RLE deficits/detail RLE Deficits / Details: 3-/5 hip strength with AAROM at hip to 95 flex and 15 abd    Cervical / Trunk Assessment: Normal  Communication   Communication: No difficulties  Cognition Arousal/Alertness: Awake/alert Behavior During Therapy: WFL for tasks assessed/performed Overall Cognitive Status: Within Functional Limits for tasks assessed                      General Comments      Exercises Total Joint Exercises Ankle Circles/Pumps: AROM;Both;10 reps;Supine Quad Sets: AROM;Both;10 reps;Supine Heel Slides: AAROM;Supine;15 reps;Right Hip ABduction/ADduction: AAROM;Right;10 reps;Supine      Assessment/Plan    PT Assessment Patient needs continued PT services  PT Diagnosis Difficulty walking   PT Problem List Decreased strength;Decreased range of motion;Decreased activity tolerance;Decreased  mobility;Decreased knowledge of use of DME;Pain  PT Treatment Interventions DME instruction;Gait training;Stair training;Functional mobility training;Therapeutic activities;Therapeutic exercise;Patient/family education   PT Goals (Current goals can be found in the Care Plan section) Acute Rehab PT Goals Patient Stated Goal: Resume previous lifestyle with decreased pain PT Goal Formulation: With patient Time For Goal Achievement:  11/04/14 Potential to Achieve Goals: Good    Frequency 7X/week   Barriers to discharge        Co-evaluation               End of Session Equipment Utilized During Treatment: Gait belt Activity Tolerance: Patient tolerated treatment well Patient left: in chair;with call bell/phone within reach;with family/visitor present Nurse Communication: Mobility status         Time: [id number] PT Time Calculation (min) (ACUTE ONLY): 30 min   Charges:   PT Evaluation $Initial PT Evaluation Tier I: 1 Procedure PT Treatments $Gait Training: 8-22 mins $Therapeutic Exercise: 8-22 mins   PT G Codes:          Ngina Royer 10/28/2014, 5:43 PM

## 2014-10-28 NOTE — Brief Op Note (Signed)
10/28/2014  9:04 AM  PATIENT:  Hennie DuosSybil G Torrico  53 y.o. female  PRE-OPERATIVE DIAGNOSIS:  Osteoarthritis right hip  POST-OPERATIVE DIAGNOSIS:  Osteoarthritis right hip  PROCEDURE:  Procedure(s): RIGHT TOTAL HIP ARTHROPLASTY ANTERIOR APPROACH (Right)  SURGEON:  Surgeon(s) and Role:    * Kathryne Hitchhristopher Y Tiondra Fang, MD - Primary  ANESTHESIA:   spinal  EBL:  Total I/O In: 1300 [I.V.:1300] Out: 650 [Urine:100; Blood:550]  BLOOD ADMINISTERED:none  DRAINS: none   LOCAL MEDICATIONS USED:  NONE  SPECIMEN:  No Specimen  DISPOSITION OF SPECIMEN:  N/A  COUNTS:  YES  TOURNIQUET:  * No tourniquets in log *  DICTATION: .Other Dictation: Dictation Number [id number]  PLAN OF CARE: Admit to inpatient   PATIENT DISPOSITION:  PACU - hemodynamically stable.   Delay start of Pharmacological VTE agent (>24hrs) due to surgical blood loss or risk of bleeding: no

## 2014-10-28 NOTE — Care Management Note (Addendum)
    Page 1 of 2   10/29/2014     11:34:06 AM CARE MANAGEMENT NOTE 10/29/2014  Patient:  [name],[name]   Account Number:  [id number]  Date Initiated:  10/28/2014  Documentation initiated by:  DAVIS,RHONDA  Subjective/Objective Assessment:   Primary osteoarthritis and degenerative joint  disease, right hip.     Action/Plan:   Right total hip arthroplasty through direct anterior  approach.   Anticipated DC Date:  10/31/2014   Anticipated DC Plan:  HOME W HOME HEALTH SERVICES  In-house referral  NA      DC Planning Services  CM consult      [facility]   Choice offered to / List presented to:  C-1 Patient   DME arranged  NA      DME agency  NA     HH arranged  HH-2 PT      HH agency  [facility]   Status of service:  Completed, signed off Medicare Important Message given?   (If response is "NO", the following Medicare IM given date fields will be blank) Date Medicare IM given:   Medicare IM given by:   Date Additional Medicare IM given:   Additional Medicare IM given by:    Discharge Disposition:  HOME W HOME HEALTH SERVICES  Per UR Regulation:  Reviewed for med. necessity/level of care/duration of stay  If discussed at Long Length of Stay Meetings, dates discussed:    Comments:  10/29/14 11:25 CM spoke with pt to offer choice of home health agency.  Pt chooses [facility] to render HHPT. Pt has no DME needs.  Address and contact information verified with pt.  Pt states her mobile is no longer valid and please reach her at facesheet home listing.  [facility] [place]  No other CM needs were communicated. Freddy JakschSarah Hortencia Martire, BSN, [place] [phone].  [id number]/Rhonda Earlene Plateravis, RN, BSN, CCM Chart reviewed. Discharge needs and patient's stay to be reviewed and followed by case manager.

## 2014-10-28 NOTE — Plan of Care (Signed)
Problem: Consults Goal: Diagnosis- Total Joint Replacement Right anterior hip     

## 2014-10-28 NOTE — Plan of Care (Signed)
Problem: Phase I Progression Outcomes Goal: CMS/Neurovascular status WDL Outcome: Completed/Met Date Met:  10/28/14 Goal: Pain controlled with appropriate interventions Outcome: Progressing Goal: Dangle or out of bed evening of surgery Outcome: Progressing Goal: Initial discharge plan identified Outcome: Completed/Met Date Met:  10/28/14 Goal: Hemodynamically stable Outcome: Completed/Met Date Met:  10/28/14 Goal: Other Phase I Outcomes/Goals Outcome: Completed/Met Date Met:  10/28/14

## 2014-10-28 NOTE — Op Note (Signed)
[name]:  [name]                 ACCOUNT NO.:  [id number]  MEDICAL RECORD NO.:  [id number]  LOCATION:  [facility]                         [facility]  PHYSICIAN:  Vanita PandaChristopher Y. Magnus IvanBlackman, M.D.DATE OF BIRTH:  [birth year]  DATE OF PROCEDURE:  10/28/2014 DATE OF DISCHARGE:                              OPERATIVE REPORT   PREOPERATIVE DIAGNOSIS:  Primary osteoarthritis and degenerative joint disease, right hip.  POSTOPERATIVE DIAGNOSIS:  Primary osteoarthritis and degenerative joint disease, right hip.  PROCEDURE:  Right total hip arthroplasty through direct anterior approach.  IMPLANTS:  DePuy Sector Gription acetabular component size 50 with an apex hole eliminator guide and a single screw, size 32+ 4 neutral polyethylene liner, size 10 Corail femoral component with standard offset, size 32+ 1 ceramic hip ball.  SURGEON:  Vanita PandaChristopher Y. Magnus IvanBlackman, M.D.  ASSISTANT:  OR staff.  ANESTHESIA:  Spinal.  ANTIBIOTICS:  2 g IV Ancef.  BLOOD LOSS:  500-600 mL.  COMPLICATIONS:  None.  INDICATIONS:  Ms. [name] is a 53 year old individual who used to be morbidly obese, but she has had a gastric bypass surgery.  She had primary osteoarthritis involving both her hips.  She underwent successful left total hip arthroplasty through a posterior approach by another surgeon in town in 2010.  Her right hip shows significant osteoarthritic changes, especially on the MRI.  Her pain is daily, her mobility is limited and her quality of life has been affected now for the right hip pain.  For this hip, she wished to proceed with a direct anterior hip surgery.  She understands the risks and benefits of surgery including the risk of acute blood loss anemia, nerve and vessel injury, fracture, infection, dislocation, DVT.  She understands the goals were decreased pain, improved mobility, and overall improved quality of life.  PROCEDURE DESCRIPTION:  After informed consent was obtained,  appropriate right hip was marked.  She was brought to the operating room and spinal anesthesia was obtained while she was on her stretcher.  Next, she was placed supine on her stretcher.  A Foley catheter was placed and then both feet had traction boots applied to them.  Next, she was placed supine on the hana fracture table with perineal post in place and both legs in inline skeletal traction devices, but no traction applied.  Her right operative hip was then prepped and draped with DuraPrep and sterile drapes.  A time-out was called to identify correct patient, correct right hip.  We then assessed the hip under direct fluoroscopy and I was able to obtain her right leg length and offset.  I made our incision inferior and posterior to the anterior superior iliac spine and carried this obliquely down the leg.  I dissected down to the tensor fascia lata muscle.  The tensor fascia was then divided longitudinally, so I could proceed with a direct anterior approach to the hip.  I cauterized the lateral femoral circumflex vessels and then placed a Cobra retractor around the medial neck and up underneath the rectus femoris around the lateral neck.  I then opened up the hip capsule in a L-type format finding a large joint effusion.  I then was able to make  my femoral neck cut with an oscillating saw just proximal to the lesser trochanter with an oscillating saw and then completed this with an osteotome.  I placed a corkscrew guide in the femoral head and removed the femoral head in its entirety and found it to be significantly devoid of cartilage.  I then cleaned the acetabulum, remnants of the acetabular labrum and debris.  I placed a bent Hohmann medially and a Cobra retractor laterally.  I then began reaming under direct visualization from a size 42 in 2 mm increments up to a size 50 with again all reamers under direct visualization and the last reamer under direct fluoroscopy, so I could also  obtain our depth of reaming and our inclination and anteversion.  Once I was pleased with this, I placed the real DePuy Sector Gription acetabular component size 50 and the apex hole eliminator and a single screw measuring 25 mm in length.  I then placed the real polyethylene liner, size 32+ 4 neutral.  We then turned attention to the femur.  With the leg externally rotated to a 100 degrees, extended and adducted, we were able to place a Mueller retractor medially and a Hohmann retractor behind the greater trochanter.  I released the lateral joint capsule.  I then used a box cutting osteotome in the femoral canal and a rongeur to lateralize.  We then began broaching from a size 8 broach using the Corail broaching system only to a size 10.  The size 10 was felt to be stable.  So, I trialed a standard neck and a 32+ 1 hip ball and brought the leg back over and up with traction and internal rotation reducing the acetabulum and it was stable.  Her leg lengths were measured near equal as well as her offsets and I was pleased with the alignment and stability.  We then dislocated the hip and removed the trial components.  I placed a real Corail femoral component with standard offset and the real 32+ 1 ceramic hip ball and reduced this back in the acetabulum.  Again, it was stable. We copiously irrigated the soft tissues with normal saline solution using pulsatile lavage.  We then closed the joint capsule with interrupted #1 Ethibond suture followed by a running #1 Vicryl in the tensor fascia, 0 Vicryl in the deep tissue, 2-0 Vicryl in the subcutaneous tissue and staples on the skin.  Xeroform and an Aquacel dressing was applied.  She was taken off the hana table and taken to recovery room in stable condition.  All final counts were correct, and there were no complications noted.     Vanita Pandahristopher Y. Magnus IvanBlackman, M.D.     CYB/MEDQ  D:  10/28/2014  T:  10/28/2014  Job:  [id number]

## 2014-10-28 NOTE — Anesthesia Preprocedure Evaluation (Addendum)
Anesthesia Evaluation  Patient identified by MRN, date of birth, ID band Patient awake    Reviewed: Allergy & Precautions, H&P , Patient's Chart, lab work & pertinent test results, reviewed documented beta blocker date and time   History of Anesthesia Complications Negative for: history of anesthetic complications  Airway Mallampati: II  TM Distance: >3 FB Neck ROM: full    Dental   Pulmonary sleep apnea ,  breath sounds clear to auscultation        Cardiovascular Exercise Tolerance: Good hypertension, Rhythm:regular Rate:Normal     Neuro/Psych    GI/Hepatic GERD-  Controlled,  Endo/Other  Hypothyroidism   Renal/GU      Musculoskeletal  (+) Arthritis -,   Abdominal   Peds  Hematology   Anesthesia Other Findings   Reproductive/Obstetrics                            Anesthesia Physical Anesthesia Plan  ASA: II  Anesthesia Plan: Spinal   Post-op Pain Management:    Induction:   Airway Management Planned:   Additional Equipment:   Intra-op Plan:   Post-operative Plan:   Informed Consent: I have reviewed the patients History and Physical, chart, labs and discussed the procedure including the risks, benefits and alternatives for the proposed anesthesia with the patient or authorized representative who has indicated his/her understanding and acceptance.   Dental Advisory Given  Plan Discussed with: CRNA and Surgeon  Anesthesia Plan Comments:        Anesthesia Quick Evaluation

## 2014-10-28 NOTE — Transfer of Care (Signed)
Immediate Anesthesia Transfer of Care Note  Patient: [name]  Procedure(s) Performed: Procedure(s): RIGHT TOTAL HIP ARTHROPLASTY ANTERIOR APPROACH (Right)  Patient Location: PACU  Anesthesia Type:Spinal  Level of Consciousness: awake, alert , oriented and patient cooperative  Airway & Oxygen Therapy: Patient Spontanous Breathing and Patient connected to face mask oxygen  Post-op Assessment: Report given to PACU RN and Post -op Vital signs reviewed and stable  Post vital signs: Reviewed and stable  Complications: No apparent anesthesia complications

## 2014-10-28 NOTE — H&P (Signed)
TOTAL HIP ADMISSION H&P  Patient is admitted for right total hip arthroplasty.  Subjective:  Chief Complaint: right hip pain  HPI: [name], 53 y.o. female, has a history of pain and functional disability in the right hip(s) due to primary osteoarthritis and patient has failed non-surgical conservative treatments for greater than 12 weeks to include NSAID's and/or analgesics, corticosteriod injections, flexibility and strengthening excercises, weight reduction as appropriate and activity modification.  Onset of symptoms was gradual starting 3 years ago with gradually worsening course since that time.The patient noted no past surgery on the right hip(s).  Patient currently rates pain in the right hip at 10 out of 10 with activity. Patient has night pain, worsening of pain with activity and weight bearing, pain that interfers with activities of daily living and pain with passive range of motion. Patient has evidence of subchondral cysts, subchondral sclerosis, periarticular osteophytes and joint space narrowing by imaging studies. This condition presents safety issues increasing the risk of falls.  There is no current active infection.  Patient Active Problem List   Diagnosis Date Noted  . Primary osteoarthritis of right hip 10/28/2014   Past Medical History  Diagnosis Date  . Hypothyroidism   . Hypertension     under control  . GERD (gastroesophageal reflux disease)   . Arthritis   . Sleep apnea     Past Surgical History  Procedure Laterality Date  . Abdominal hysterectomy  2005  . Laparoscopic gastric sleeve resection  august 2015  . Joint replacement Left 2010    hip  . Knee arthroscopy with anterior cruciate ligament (acl) repair Right 2001  . Cesarean section  1991, 1993    x2    Prescriptions prior to admission  Medication Sig Dispense Refill Last Dose  . levothyroxine (SYNTHROID, LEVOTHROID) 88 MCG tablet Take 88 mcg by mouth daily before breakfast.   10/28/2014 at 0445   . lisinopril (PRINIVIL,ZESTRIL) 10 MG tablet Take 10 mg by mouth every morning.   10/27/2014 at am  . omeprazole (PRILOSEC) 40 MG capsule Take 40 mg by mouth daily.   5 10/26/2014   Allergies  Allergen Reactions  . Codeine Nausea Only    History  Substance Use Topics  . Smoking status: Never Smoker   . Smokeless tobacco: Never Used  . Alcohol Use: No    History reviewed. No pertinent family history.   Review of Systems  Musculoskeletal: Positive for joint pain.  All other systems reviewed and are negative.   Objective:  Physical Exam  Constitutional: She is oriented to person, place, and time. She appears well-developed and well-nourished.  HENT:  Head: Normocephalic and atraumatic.  Eyes: EOM are normal. Pupils are equal, round, and reactive to light.  Neck: Normal range of motion. Neck supple.  Cardiovascular: Normal rate and regular rhythm.   Respiratory: Effort normal and breath sounds normal.  GI: Soft. Bowel sounds are normal.  Musculoskeletal:       Right hip: She exhibits decreased range of motion, decreased strength and bony tenderness.  Neurological: She is alert and oriented to person, place, and time.  Skin: Skin is warm and dry.  Psychiatric: She has a normal mood and affect.    Vital signs in last 24 hours: Temp:  [97.4 F (36.3 C)] 97.4 F (36.3 C) (11/27 0531) Pulse Rate:  [65] 65 (11/27 0531) Resp:  [18] 18 (11/27 0531) BP: (120)/(72) 120/72 mmHg (11/27 0531) SpO2:  [98 %] 98 % (11/27 0531) Weight:  [16.109[88.451  kg (195 lb)] 88.451 kg (195 lb) (11/27 0531)  Labs:   Estimated body mass index is 32.45 kg/(m^2) as calculated from the following:   Height as of this encounter: 5\' 5"  (1.651 m).   Weight as of this encounter: 88.451 kg (195 lb).   Imaging Review Plain radiographs demonstrate moderate degenerative joint disease of the right hip(s). The bone quality appears to be excellent for age and reported activity level.  Assessment/Plan:  End  stage arthritis, right hip(s)  The patient history, physical examination, clinical judgement of the provider and imaging studies are consistent with end stage degenerative joint disease of the right hip(s) and total hip arthroplasty is deemed medically necessary. The treatment options including medical management, injection therapy, arthroscopy and arthroplasty were discussed at length. The risks and benefits of total hip arthroplasty were presented and reviewed. The risks due to aseptic loosening, infection, stiffness, dislocation/subluxation,  thromboembolic complications and other imponderables were discussed.  The patient acknowledged the explanation, agreed to proceed with the plan and consent was signed. Patient is being admitted for inpatient treatment for surgery, pain control, PT, OT, prophylactic antibiotics, VTE prophylaxis, progressive ambulation and ADL's and discharge planning.The patient is planning to be discharged home with home health services

## 2014-10-28 NOTE — Anesthesia Procedure Notes (Signed)
Spinal Patient location during procedure: OR Start time: 10/28/2014 7:33 AM End time: 10/28/2014 7:38 AM Staffing Anesthesiologist: Rudean Curt Resident/CRNA: Lajuana Carry E Performed by: resident/CRNA  Preanesthetic Checklist Completed: patient identified, site marked, surgical consent, pre-op evaluation, timeout performed, IV checked, risks and benefits discussed and monitors and equipment checked Spinal Block Patient position: sitting Prep: Betadine Patient monitoring: heart rate, continuous pulse ox and blood pressure Location: L3-4 Injection technique: single-shot Needle Needle type: Sprotte  Needle gauge: 24 G Needle length: 10 cm Assessment Sensory level: T6 Additional Notes Expiration date of kit checked and confirmed. Time out performed. Clear CSF, neg heme noted. Patient tolerated procedure well, without complications.

## 2014-10-28 NOTE — Anesthesia Postprocedure Evaluation (Signed)
Anesthesia Post Note  Patient: [name]  Procedure(s) Performed: Procedure(s) (LRB): RIGHT TOTAL HIP ARTHROPLASTY ANTERIOR APPROACH (Right)  Anesthesia type: Spinal  Patient location: PACU  Post pain: Pain level controlled  Post assessment: Post-op Vital signs reviewed  Last Vitals:  Filed Vitals:   10/28/14 0945  BP: 90/51  Pulse: 53  Temp:   Resp: 20    Post vital signs: Reviewed  Level of consciousness: awake  Complications: No apparent anesthesia complications

## 2014-10-29 LAB — BASIC METABOLIC PANEL
ANION GAP: 11 (ref 5–15)
BUN: 17 mg/dL (ref 6–23)
CHLORIDE: 106 meq/L (ref 96–112)
CO2: 24 meq/L (ref 19–32)
CREATININE: 0.62 mg/dL (ref 0.50–1.10)
Calcium: 9.9 mg/dL (ref 8.4–10.5)
GFR calc Af Amer: >90 mL/min (ref >90)
GFR calc non Af Amer: >90 mL/min (ref >90)
Glucose, Bld: 93 mg/dL (ref 70–99)
Potassium: 4 mEq/L (ref 3.7–5.3)
SODIUM: 141 meq/L (ref 137–147)

## 2014-10-29 LAB — CBC
HEMATOCRIT: 31.5 % — AB (ref 36.0–46.0)
HEMOGLOBIN: 10.4 g/dL — AB (ref 12.0–15.0)
MCH: 29.6 pg (ref 26.0–34.0)
MCHC: 33 g/dL (ref 30.0–36.0)
MCV: 89.7 fL (ref 78.0–100.0)
Platelets: 187 10*3/uL (ref 150–400)
RBC: 3.51 MIL/uL — ABNORMAL LOW (ref 3.87–5.11)
RDW: 13.5 % (ref 11.5–15.5)
WBC: 9.8 10*3/uL (ref 4.0–10.5)

## 2014-10-29 MED ORDER — METHOCARBAMOL 500 MG PO TABS: 500.0000 mg | ORAL_TABLET | Freq: Four times a day (QID) | ORAL | Status: AC | PRN

## 2014-10-29 MED ORDER — ASPIRIN 325 MG PO TBEC: 325.0000 mg | DELAYED_RELEASE_TABLET | Freq: Two times a day (BID) | ORAL | Status: AC

## 2014-10-29 MED ORDER — ONDANSETRON HCL 4 MG PO TABS: 4.0000 mg | ORAL_TABLET | Freq: Three times a day (TID) | ORAL | Status: AC | PRN

## 2014-10-29 MED ORDER — OXYCODONE-ACETAMINOPHEN 5-325 MG PO TABS: 1.0000 | ORAL_TABLET | ORAL | Status: AC | PRN

## 2014-10-29 NOTE — Discharge Summary (Signed)
Patient ID: [name] MRN: [id number] DOB/AGE: 55/04/1961 53 y.o.  Admit date: 10/28/2014 Discharge date: 10/29/2014  Admission Diagnoses:  Principal Problem:   Primary osteoarthritis of right hip Active Problems:   Status post total replacement of right hip   Discharge Diagnoses:  Same  Past Medical History  Diagnosis Date  . Hypothyroidism   . Hypertension     under control  . GERD (gastroesophageal reflux disease)   . Arthritis   . Sleep apnea     Surgeries: Procedure(s): RIGHT TOTAL HIP ARTHROPLASTY ANTERIOR APPROACH on 10/28/2014   Consultants:    Discharged Condition: Improved  Hospital Course: [name] is an 53 y.o. female who was admitted 10/28/2014 for operative treatment ofArthritis of right hip. Patient has severe unremitting pain that affects sleep, daily activities, and work/hobbies. After pre-op clearance the patient was taken to the operating room on 10/28/2014 and underwent  Procedure(s): RIGHT TOTAL HIP ARTHROPLASTY ANTERIOR APPROACH.    Patient was given perioperative antibiotics: Anti-infectives    Start     Dose/Rate Route Frequency Ordered Stop   10/28/14 1400  ceFAZolin (ANCEF) IVPB 1 g/50 mL premix     1 g100 mL/hr over 30 Minutes Intravenous Every 6 hours 10/28/14 1121 10/28/14 2021   10/28/14 0530  ceFAZolin (ANCEF) IVPB 2 g/50 mL premix     2 g100 mL/hr over 30 Minutes Intravenous On call to O.R. 10/28/14 0530 10/28/14 0727       Patient was given sequential compression devices, early ambulation, and chemoprophylaxis to prevent DVT.  Patient benefited maximally from hospital stay and there were no complications.    Recent vital signs: Patient Vitals for the past 24 hrs:  BP Temp Temp src Pulse Resp SpO2 Height Weight  10/29/14 0537 (!) 97/59 mmHg 98.3 F (36.8 C) Oral 60 14 100 % - -  10/29/14 0400 - - - - 14 97 % - -  10/29/14 0106 (!) 92/58 mmHg - - - - - - -  10/29/14 0057 (!) 89/46 mmHg 97.7 F (36.5 C) Oral 62 12 100 %  - -  10/29/14 0000 - - - - 17 96 % - -  10/28/14 2310 (!) 97/57 mmHg - - - - - - -  10/28/14 2147 (!) 90/40 mmHg 98.4 F (36.9 C) Oral 64 12 98 % - -  10/28/14 2000 - - - - 16 95 % - -  10/28/14 1626 (!) 99/47 mmHg - - - - - - -  10/28/14 1515 (!) 100/52 mmHg 97.9 F (36.6 C) - 64 14 98 % - -  10/28/14 1320 (!) 82/50 mmHg - - - - - - -  10/28/14 1315 (!) 84/49 mmHg 97.9 F (36.6 C) Oral (!) 57 12 97 % - -  10/28/14 1211 (!) 102/58 mmHg 97.8 F (36.6 C) - 62 18 98 % - -  10/28/14 1115 (!) 92/52 mmHg 97.9 F (36.6 C) - (!) 56 15 100 % 5\' 5"  (1.651 m) 88.451 kg (195 lb)  10/28/14 1100 (!) 91/58 mmHg 98 F (36.7 C) - (!) 59 16 100 % - -  10/28/14 1045 (!) 91/58 mmHg - - (!) 51 18 98 % - -  10/28/14 1030 (!) 91/52 mmHg 98 F (36.7 C) - (!) 59 16 100 % - -  10/28/14 1015 (!) 86/51 mmHg - - (!) 53 19 100 % - -  10/28/14 1000 (!) 83/48 mmHg 98 F (36.7 C) - (!) 51 16 100 % - -  Recent laboratory studies:  Recent Labs  10/29/14 0515  WBC 9.8  HGB 10.4*  HCT 31.5*  PLT 187  NA 141  K 4.0  CL 106  CO2 24  BUN 17  CREATININE 0.62  GLUCOSE 93  CALCIUM 9.9     Discharge Medications:     Medication List    TAKE these medications        aspirin 325 MG EC tablet  Take 1 tablet (325 mg total) by mouth 2 (two) times daily after a meal.     levothyroxine 88 MCG tablet  Commonly known as:  SYNTHROID, LEVOTHROID  Take 88 mcg by mouth daily before breakfast.     lisinopril 10 MG tablet  Commonly known as:  PRINIVIL,ZESTRIL  Take 10 mg by mouth every morning.     methocarbamol 500 MG tablet  Commonly known as:  ROBAXIN  Take 1 tablet (500 mg total) by mouth every 6 (six) hours as needed for muscle spasms.     omeprazole 40 MG capsule  Commonly known as:  PRILOSEC  Take 40 mg by mouth daily.     ondansetron 4 MG tablet  Commonly known as:  ZOFRAN  Take 1 tablet (4 mg total) by mouth every 8 (eight) hours as needed for nausea or vomiting.     oxyCODONE-acetaminophen  5-325 MG per tablet  Commonly known as:  ROXICET  Take 1-2 tablets by mouth every 4 (four) hours as needed.        Diagnostic Studies: Dg Chest 2 View  10/19/2014   CLINICAL DATA:  Preop right total hip replacement.  EXAM: CHEST  2 VIEW  COMPARISON:  12/31/2013  FINDINGS: The heart size and mediastinal contours are within normal limits. Both lungs are clear. The visualized skeletal structures are unremarkable.  IMPRESSION: No active cardiopulmonary disease.   Electronically Signed   By: Signa Kellaylor  Stroud M.D.   On: 10/19/2014 08:20   Dg Hip Complete Right  10/28/2014   CLINICAL DATA:  Status post right total hip joint replacement  EXAM: DG C-ARM 1-60 MIN - NRPT [facility]; RIGHT HIP - COMPLETE 2+ VIEW  COMPARISON:  MRI of the hips of June 02, 2014  FINDINGS: Fluoro time reported as 0.3 min. AP views of the pelvis and right hip reveal the presence of prosthetic joints bilaterally. The positioning of the right hip prosthesis is radiographically good. The interface with the native bone is unremarkable.  IMPRESSION: The patient has undergone right total hip joint prosthesis placement without evidence of immediate postprocedure complication.   Electronically Signed   By: David  [place]azil[place]   On: 10/28/2014 08:54   Dg Pelvis Portable  10/28/2014   CLINICAL DATA:  Status post right total hip replacement.  EXAM: PORTABLE PELVIS 1-2 VIEWS  COMPARISON:  Portable lateral right hip obtained at the same time.  FINDINGS: Bilateral total hip prostheses. Postoperative air in the soft tissues on the right with overlying skin clips. The prostheses are in satisfactory position and alignment with no fracture or dislocation seen.  IMPRESSION: Satisfactory postoperative appearance of a right total hip prosthesis.   Electronically Signed   By: Gordan PaymentSteve  Reid M.D.   On: 10/28/2014 09:39   Dg Hip Portable 1 View Right  10/28/2014   CLINICAL DATA:  53 year old right hip replacement  EXAM: PORTABLE RIGHT HIP - 1 VIEW  COMPARISON:   10/28/2014  FINDINGS: Single radiograph demonstrates the presence of a right hip joint prosthesis. The acetabular component is well seated. The femoral component  appears to be well seated. Expected subcutaneous emphysema is present.  IMPRESSION: Radiographically intact right hip prosthesis.   Electronically Signed   By: Fannie Knee   On: 10/28/2014 09:39   Dg C-arm 1-60 Min-no Report  10/28/2014   CLINICAL DATA:  Status post right total hip joint replacement  EXAM: DG C-ARM 1-60 MIN - [facility]; RIGHT HIP - COMPLETE 2+ VIEW  COMPARISON:  MRI of the hips of June 02, 2014  FINDINGS: Fluoro time reported as 0.3 min. AP views of the pelvis and right hip reveal the presence of prosthetic joints bilaterally. The positioning of the right hip prosthesis is radiographically good. The interface with the native bone is unremarkable.  IMPRESSION: The patient has undergone right total hip joint prosthesis placement without evidence of immediate postprocedure complication.   Electronically Signed   By: David  [place]   On: 10/28/2014 08:54    Disposition: to home      Discharge Instructions    Call MD / Call 911    Complete by:  As directed   If you experience chest pain or shortness of breath, CALL 911 and be transported to the hospital emergency room.  If you develope a fever above 101 F, pus (Sato drainage) or increased drainage or redness at the wound, or calf pain, call your surgeon's office.     Constipation Prevention    Complete by:  As directed   Drink plenty of fluids.  Prune juice may be helpful.  You may use a stool softener, such as Colace (over the counter) 100 mg twice a day.  Use MiraLax (over the counter) for constipation as needed.     Diet - low sodium heart healthy    Complete by:  As directed      Discharge instructions    Complete by:  As directed   Increase your activities as comfort allows. Full weight as tolerated. Expect thigh swelling; ice as needed. Expect knee pain. You can  get your current dressing wet in the shower daily. Leave your current dressing on and in place until your outpatient follow-up. Do take an over-the-counter stool softener daily to twice daily as needed.     Discharge patient    Complete by:  As directed      Increase activity slowly as tolerated    Complete by:  As directed            Follow-up Information    Follow up with Kathryne Hitch, MD In 2 weeks.   Specialty:  Orthopedic Surgery   Contact information:   [address] [place] [address] [phone]        Signed: Kathryne Hitch 10/29/2014, 9:59 AM

## 2014-10-29 NOTE — Plan of Care (Signed)
Problem: Phase II Progression Outcomes Goal: Ambulates Outcome: Completed/Met Date Met:  10/29/14 Goal: Discharge plan established Outcome: Completed/Met Date Met:  10/29/14 Goal: Other Phase II Outcomes/Goals Outcome: Completed/Met Date Met:  10/29/14  Problem: Phase III Progression Outcomes Goal: Pain controlled on oral analgesia Outcome: Completed/Met Date Met:  10/29/14 Goal: Ambulates Outcome: Completed/Met Date Met:  10/29/14 Goal: Incision clean - minimal/no drainage Outcome: Completed/Met Date Met:  10/29/14 Goal: Discharge plan remains appropriate-arrangements made Outcome: Completed/Met Date Met:  10/29/14 Goal: Anticoagulant follow-up in place Outcome: Not Applicable Date Met:  47/39/58 asa Goal: Other Phase III Outcomes/Goals Outcome: Not Applicable Date Met:  44/17/12  Problem: Discharge Progression Outcomes Goal: Barriers To Progression Addressed/Resolved Outcome: Not Applicable Date Met:  78/71/83 Goal: CMS/Neurovascular status at or above baseline Outcome: Completed/Met Date Met:  10/29/14 Goal: Anticoagulant follow-up in place Outcome: Not Applicable Date Met:  67/25/50 asa Goal: Pain controlled with appropriate interventions Outcome: Completed/Met Date Met:  10/29/14 Goal: Hemodynamically stable Outcome: Completed/Met Date Met:  01/64/29 Goal: Complications resolved/controlled Outcome: Not Applicable Date Met:  03/79/55 Goal: Tolerates diet Outcome: Completed/Met Date Met:  10/29/14 Goal: Activity appropriate for discharge plan Outcome: Completed/Met Date Met:  10/29/14 Goal: Ambulates safely using assistive device Outcome: Completed/Met Date Met:  10/29/14 Goal: Follows weight - bearing limitations Outcome: Completed/Met Date Met:  10/29/14 Goal: Discharge plan in place and appropriate Outcome: Completed/Met Date Met:  10/29/14 Goal: Negotiates stairs Outcome: Completed/Met Date Met:  10/29/14 Goal: Demonstrates ADLs as appropriate Outcome:  Completed/Met Date Met:  10/29/14 Goal: Incision without S/S infection Outcome: Completed/Met Date Met:  10/29/14 Goal: Other Discharge Outcomes/Goals Outcome: Not Applicable Date Met:  83/16/74

## 2014-10-29 NOTE — Progress Notes (Signed)
OT Cancellation Note  Patient Details Name: [name] MRN: [id number] DOB: 12/18/1960   Cancelled Treatment:    Reason Eval/Treat Not Completed: OT screened, no needs identified, will sign off Per PT- no OT needs- thanks,   Alba CoryREDDING, Devin Foskey D 10/29/2014, 11:45 AM

## 2014-10-29 NOTE — Plan of Care (Signed)
Problem: Phase I Progression Outcomes Goal: Pain controlled with appropriate interventions Outcome: Completed/Met Date Met:  10/29/14 Goal: Dangle or out of bed evening of surgery Outcome: Completed/Met Date Met:  10/29/14  Problem: Phase II Progression Outcomes Goal: Tolerating diet Outcome: Completed/Met Date Met:  10/29/14

## 2014-10-29 NOTE — Progress Notes (Signed)
Physical Therapy Treatment Patient Details Name: [name] MRN: [id number] DOB: 12/15/1960 Today's Date: 10/29/2014    History of Present Illness R THR    PT Comments    Pt progressing exceptionally well.  Reviewed stairs and car transfers with pt and dtr.  Follow Up Recommendations  Home health PT     Equipment Recommendations  None recommended by PT    Recommendations for Other Services OT consult     Precautions / Restrictions Precautions Precautions: Fall Restrictions Weight Bearing Restrictions: No Other Position/Activity Restrictions: WBAT    Mobility  Bed Mobility Overal bed mobility: Needs Assistance Bed Mobility: Supine to Sit     Supine to sit: Supervision     General bed mobility comments: cues for sequence and use of L LE to self assist  Transfers Overall transfer level: Needs assistance Equipment used: Rolling walker (2 wheeled) Transfers: Sit to/from Stand Sit to Stand: Min guard;Supervision         General transfer comment: cues for LE management and use of UEs to self assist  Ambulation/Gait Ambulation/Gait assistance: Min guard;Supervision Ambulation Distance (Feet): 300 Feet Assistive device: Rolling walker (2 wheeled) Gait Pattern/deviations: Step-to pattern;Step-through pattern;Decreased step length - right;Decreased step length - left;Shuffle Gait velocity: mod pace   General Gait Details: min cues for posture, position from RW and initial sequence   Stairs Stairs: Yes Stairs assistance: Min assist Stair Management: No rails;Step to pattern;Backwards;With walker Number of Stairs: 4 General stair comments: cues for sequence and foot/RW placement  Wheelchair Mobility    Modified Rankin (Stroke Patients Only)       Balance                                    Cognition Arousal/Alertness: Awake/alert Behavior During Therapy: WFL for tasks assessed/performed Overall Cognitive Status: Within Functional  Limits for tasks assessed                      Exercises Total Joint Exercises Ankle Circles/Pumps: AROM;Both;10 reps;Supine Quad Sets: AROM;Both;10 reps;Supine Heel Slides: AAROM;Supine;Right;20 reps Hip ABduction/ADduction: AAROM;Right;Supine;15 reps    General Comments        Pertinent Vitals/Pain Pain Assessment: 0-10 Pain Score: 2  Pain Location: R hip Pain Descriptors / Indicators: Sore Pain Intervention(s): Limited activity within patient's tolerance;Monitored during session;Premedicated before session;Ice applied    Home Living                      Prior Function            PT Goals (current goals can now be found in the care plan section) Acute Rehab PT Goals Patient Stated Goal: Resume previous lifestyle with decreased pain PT Goal Formulation: With patient Time For Goal Achievement: 11/04/14 Potential to Achieve Goals: Good Progress towards PT goals: Progressing toward goals    Frequency  7X/week    PT Plan Current plan remains appropriate    Co-evaluation             End of Session Equipment Utilized During Treatment: Gait belt Activity Tolerance: Patient tolerated treatment well Patient left: in chair;with call bell/phone within reach;with family/visitor present     Time: [id number] PT Time Calculation (min) (ACUTE ONLY): 33 min  Charges:  $Gait Training: 8-22 mins $Therapeutic Exercise: 8-22 mins  G Codes:      Tura Roller 10/29/2014, 10:01 AM

## 2014-10-29 NOTE — Progress Notes (Signed)
Subjective: 1 Day Post-Op Procedure(s) (LRB): RIGHT TOTAL HIP ARTHROPLASTY ANTERIOR APPROACH (Right) Patient reports pain as moderate.  Did very well with therapy.  Acute blood loss anemia, some hypotension, but denies light-headedness.  Objective: Vital signs in last 24 hours: Temp:  [97.7 F (36.5 C)-98.4 F (36.9 C)] 98.3 F (36.8 C) (11/28 0537) Pulse Rate:  [51-64] 60 (11/28 0537) Resp:  [12-19] 14 (11/28 0537) BP: (82-102)/(40-59) 97/59 mmHg (11/28 0537) SpO2:  [95 %-100 %] 100 % (11/28 0537) Weight:  [88.451 kg (195 lb)] 88.451 kg (195 lb) (11/27 1115)  Intake/Output from previous day: 11/27 0701 - 11/28 0700 In: 5596.3 [P.O.:1180; I.V.:4311.3; IV Piggyback:105] Out: 2600 [Urine:2050; Blood:550] Intake/Output this shift:     Recent Labs  10/29/14 0515  HGB 10.4*    Recent Labs  10/29/14 0515  WBC 9.8  RBC 3.51*  HCT 31.5*  PLT 187    Recent Labs  10/29/14 0515  NA 141  K 4.0  CL 106  CO2 24  BUN 17  CREATININE 0.62  GLUCOSE 93  CALCIUM 9.9   No results for input(s): LABPT, INR in the last 72 hours.  Sensation intact distally Intact pulses distally Dorsiflexion/Plantar flexion intact Incision: dressing C/D/I  Assessment/Plan: 1 Day Post-Op Procedure(s) (LRB): RIGHT TOTAL HIP ARTHROPLASTY ANTERIOR APPROACH (Right) Up with therapy  Discharge today.  Nicolas Sisler Y 10/29/2014, 9:55 AM

## 2014-10-29 NOTE — Progress Notes (Signed)
Discharged from floor via w/c, family with pt. No changes in assessment. Pricilla Moehle  

## 2014-10-31 ENCOUNTER — Encounter (HOSPITAL_COMMUNITY): Payer: Self-pay | Admitting: Orthopaedic Surgery

## 2014-10-31 NOTE — Progress Notes (Signed)
Discharge summary sent to payer through [facility]  

## 2021-02-26 ENCOUNTER — Ambulatory Visit: Payer: BLUE CROSS/BLUE SHIELD | Admitting: Orthopaedic Surgery
